# Patient Record
Sex: Female | Born: 1985 | Race: White | Hispanic: No | Marital: Single | Smoking: Current every day smoker
Health system: Southern US, Community
[De-identification: ages and names within clinical notes are randomized; demographics above are authoritative.]

## PROBLEM LIST (undated history)

## (undated) DIAGNOSIS — G43909 Migraine, unspecified, not intractable, without status migrainosus: Secondary | ICD-10-CM

## (undated) HISTORY — PX: WRIST SURGERY: SHX841

---
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
## 2007-08-22 ENCOUNTER — Emergency Department (HOSPITAL_COMMUNITY): Admission: EM | Admit: 2007-08-22 | Discharge: 2007-08-22 | Payer: Self-pay | Admitting: Emergency Medicine

## 2008-04-02 ENCOUNTER — Encounter: Admission: RE | Admit: 2008-04-02 | Discharge: 2008-04-02 | Payer: Self-pay | Admitting: Obstetrics & Gynecology

## 2008-04-12 ENCOUNTER — Emergency Department (HOSPITAL_COMMUNITY): Admission: EM | Admit: 2008-04-12 | Discharge: 2008-04-12 | Payer: Self-pay | Admitting: Emergency Medicine

## 2009-04-09 IMAGING — CR DG CHEST 2V
2 series · 2 of 2 positions shown · non-contrast
Comparison: None

CLINICAL DATA: Chest pain

CHEST - 2 VIEW

[view not recorded (1 of 2)]
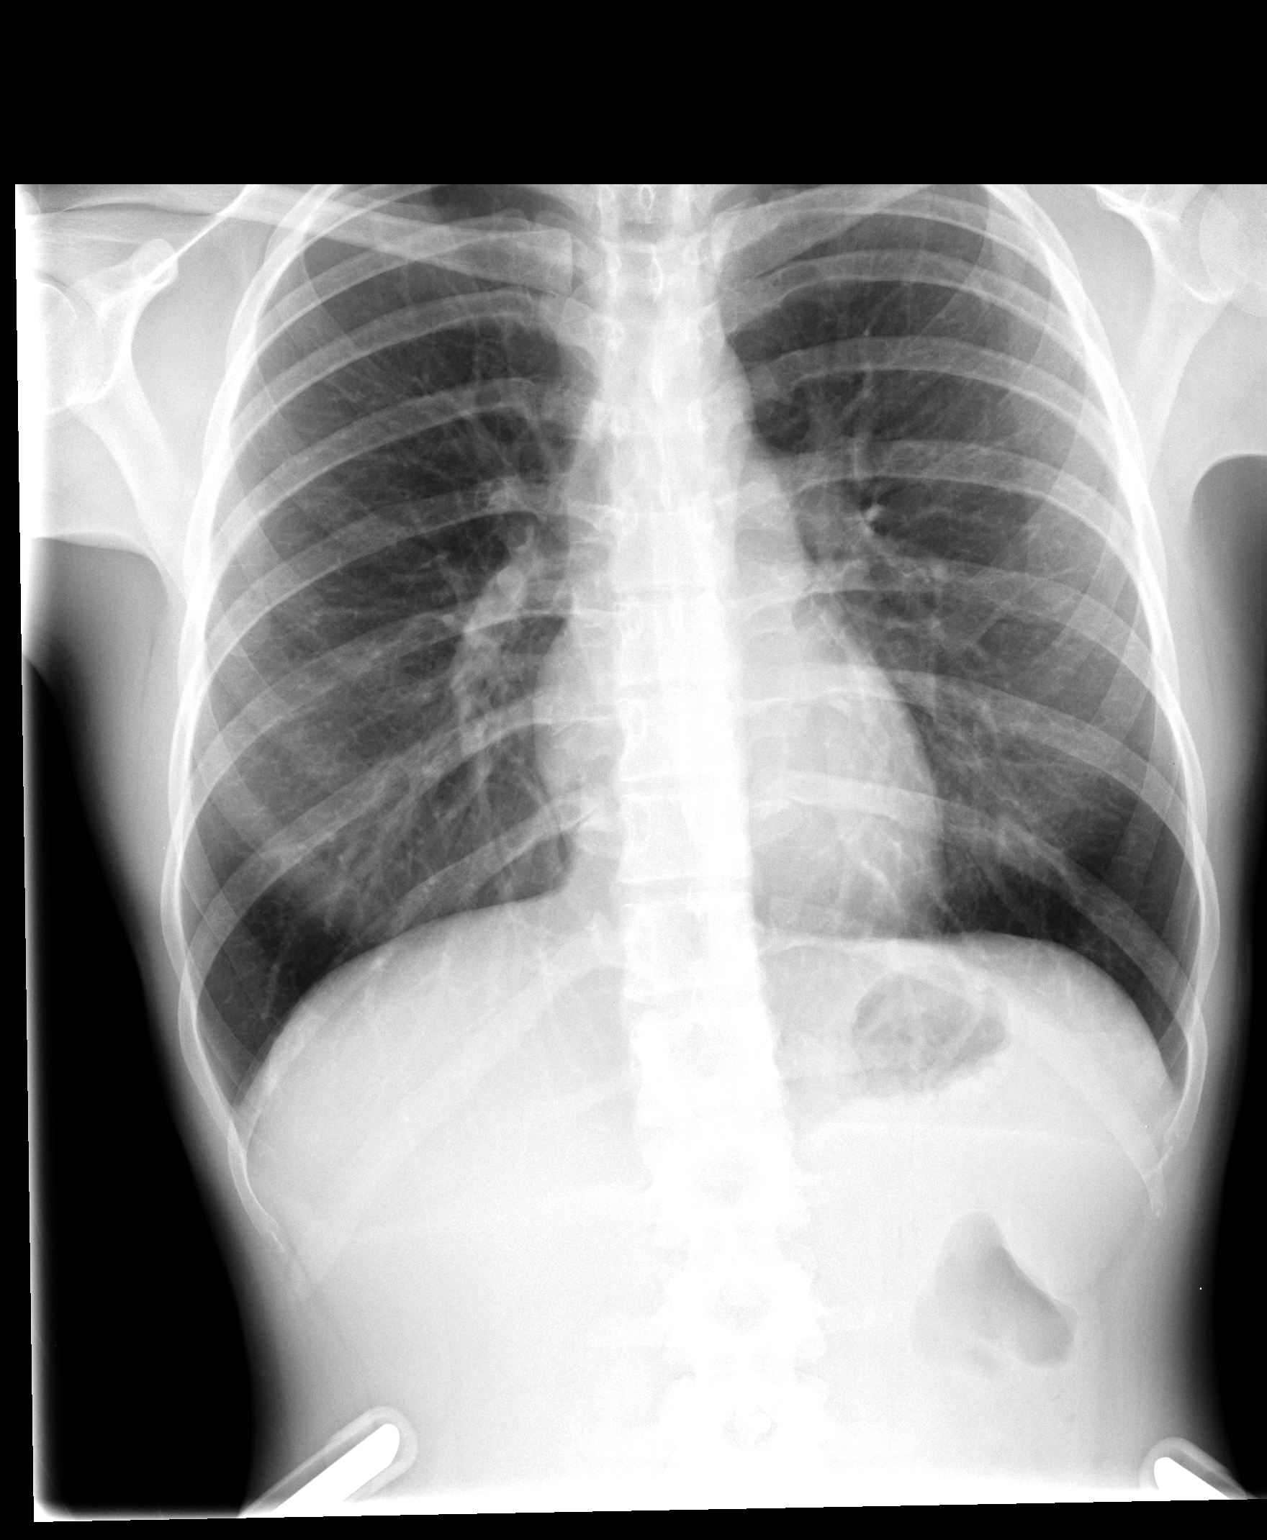

[view not recorded (2 of 2)]
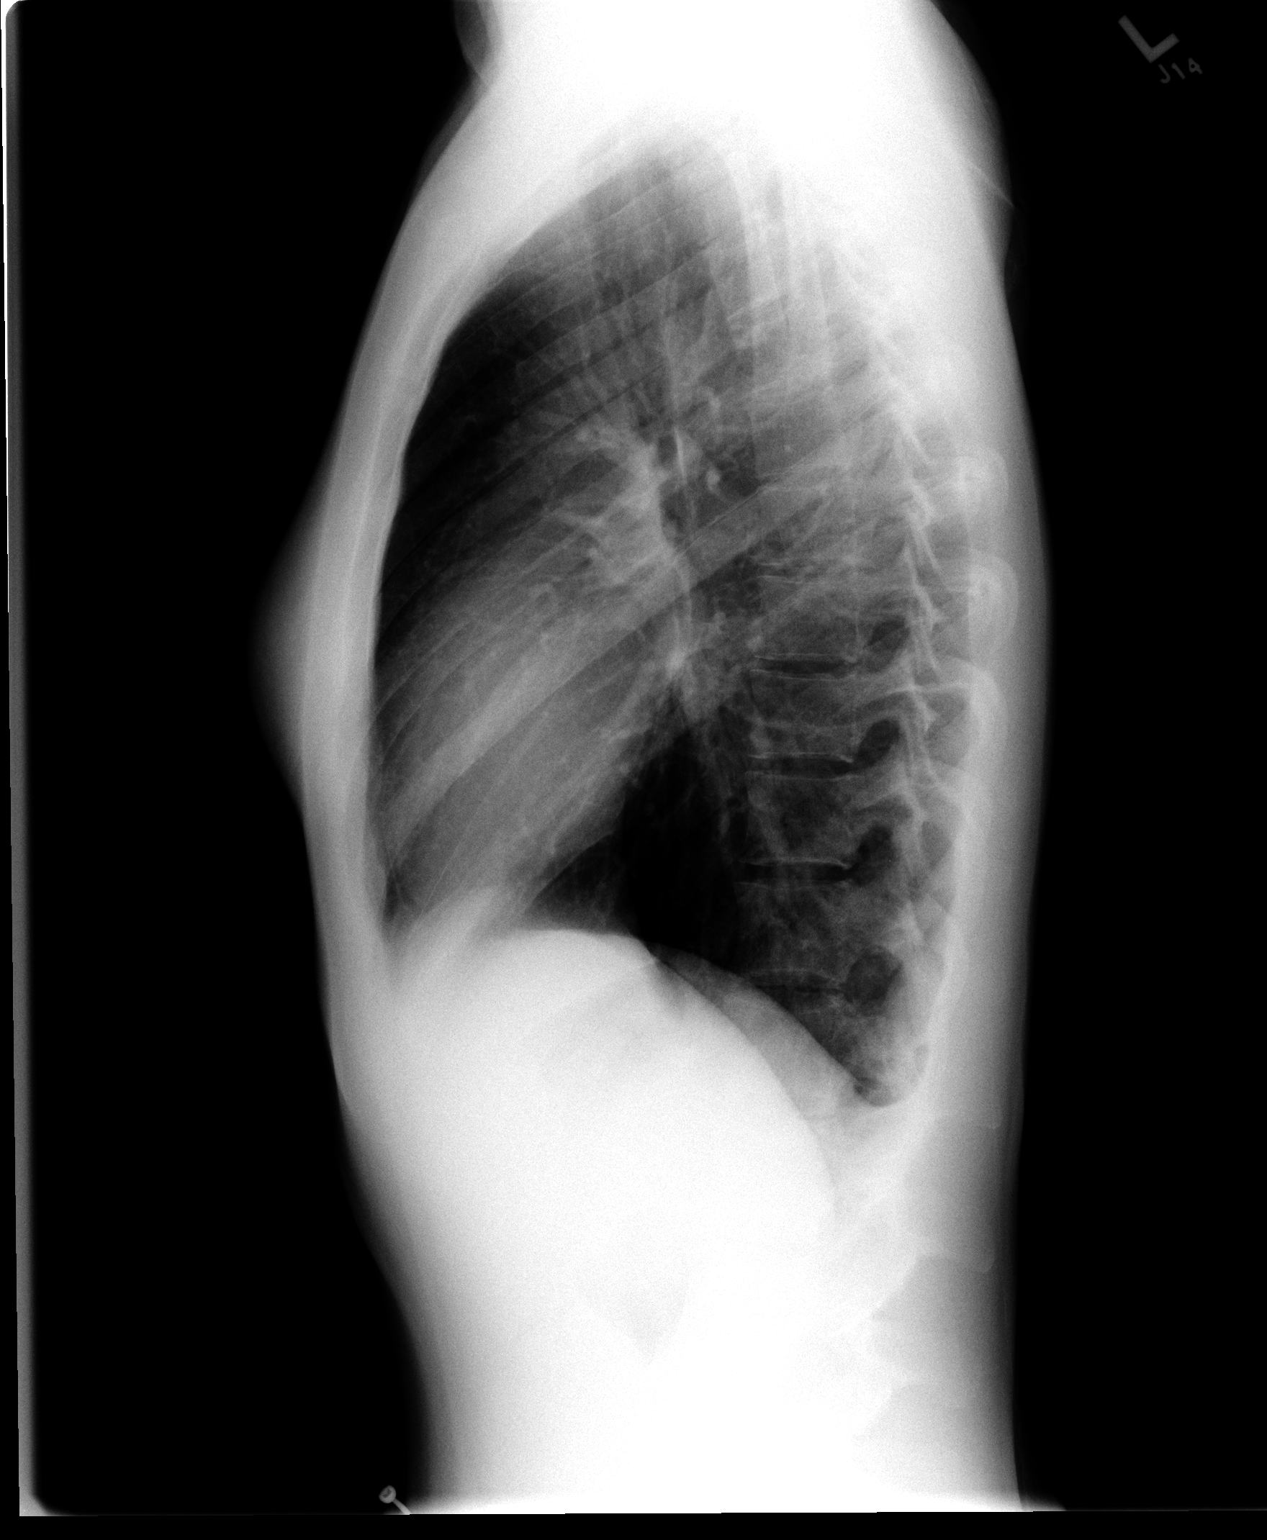

[2 of 2 positions shown; findings below may reference images not displayed]

FINDINGS: The cardiomediastinal silhouette is unremarkable.  There
is no acute infiltrate or pleural effusion.  Bony structures are
unremarkable.
IMPRESSION: No active disease.

## 2011-02-25 LAB — URINALYSIS, ROUTINE W REFLEX MICROSCOPIC
Bilirubin Urine: NEGATIVE
Glucose, UA: NEGATIVE
Ketones, ur: NEGATIVE
Nitrite: NEGATIVE
Protein, ur: NEGATIVE
Specific Gravity, Urine: 1.008
Urobilinogen, UA: 0.2
pH: 7

## 2011-02-25 LAB — URINE CULTURE

## 2011-02-25 LAB — URINE MICROSCOPIC-ADD ON

## 2011-02-25 LAB — PREGNANCY, URINE: Preg Test, Ur: NEGATIVE

## 2014-11-01 ENCOUNTER — Emergency Department (HOSPITAL_COMMUNITY)
Admission: EM | Admit: 2014-11-01 | Discharge: 2014-11-01 | Disposition: A | Discharge status: Home / Self Care | Payer: Self-pay | Attending: Emergency Medicine | Admitting: Emergency Medicine

## 2014-11-01 ENCOUNTER — Encounter (HOSPITAL_COMMUNITY): Payer: Self-pay | Admitting: Emergency Medicine

## 2014-11-01 DIAGNOSIS — Z3202 Encounter for pregnancy test, result negative: Secondary | ICD-10-CM | POA: Insufficient documentation

## 2014-11-01 DIAGNOSIS — Z720 Tobacco use: Secondary | ICD-10-CM | POA: Insufficient documentation

## 2014-11-01 DIAGNOSIS — G43809 Other migraine, not intractable, without status migrainosus: Secondary | ICD-10-CM | POA: Insufficient documentation

## 2014-11-01 DIAGNOSIS — Z79899 Other long term (current) drug therapy: Secondary | ICD-10-CM | POA: Insufficient documentation

## 2014-11-01 HISTORY — DX: Migraine, unspecified, not intractable, without status migrainosus: G43.909

## 2014-11-01 LAB — I-STAT BETA HCG BLOOD, ED (MC, WL, AP ONLY)

## 2014-11-01 MED ORDER — MAGNESIUM SULFATE 2 GM/50ML IV SOLN
2.0000 g | Freq: Once | INTRAVENOUS | Status: AC
  Administered 2014-11-01: 2 g via INTRAVENOUS
  Filled 2014-11-01: qty 50

## 2014-11-01 MED ORDER — KETOROLAC TROMETHAMINE 30 MG/ML IJ SOLN
30.0000 mg | Freq: Once | INTRAMUSCULAR | Status: AC
  Administered 2014-11-01: 30 mg via INTRAVENOUS
  Filled 2014-11-01: qty 1

## 2014-11-01 MED ORDER — HYDROMORPHONE HCL 1 MG/ML IJ SOLN
1.0000 mg | Freq: Once | INTRAMUSCULAR | Status: AC
  Administered 2014-11-01: 1 mg via INTRAVENOUS
  Filled 2014-11-01: qty 1

## 2014-11-01 MED ORDER — DIPHENHYDRAMINE HCL 50 MG/ML IJ SOLN
50.0000 mg | Freq: Once | INTRAMUSCULAR | Status: AC
  Administered 2014-11-01: 50 mg via INTRAVENOUS
  Filled 2014-11-01: qty 1

## 2014-11-01 MED ORDER — METOCLOPRAMIDE HCL 5 MG/ML IJ SOLN
10.0000 mg | Freq: Once | INTRAMUSCULAR | Status: AC
  Administered 2014-11-01: 10 mg via INTRAVENOUS
  Filled 2014-11-01: qty 2

## 2014-11-01 NOTE — ED Provider Notes (Signed)
CSN: [id number]     Arrival date & time 11/01/14  1148 History   First MD Initiated Contact with Patient 11/01/14 1158     Chief Complaint  Patient presents with  . Migraine     (Consider location/radiation/quality/duration/timing/severity/associated sxs/prior Treatment) HPI Jodi Carlson is a 29 y.o. female with history of migraines who comes in for evaluation of acute migraine. Patient states she has had a migraine off and on since Sunday. She reports some relief with taking her friend's Demerol, Phenergan and amitriptyline. She reports her headache now is similar to migraine pain that she has had in the past, localized to right hemisphere and behind right eye. Rates discomfort as severe. Onset was gradual. She reports associated nausea and vomiting, photophobia and phonophobia. Denies any numbness or weakness, fevers or chills, sick contacts, recent travel, pregnancy--states "I'm gay".  Past Medical History  Diagnosis Date  . Migraine    Past Surgical History  Procedure Laterality Date  . Wrist surgery Right    No family history on file. History  Substance Use Topics  . Smoking status: Current Every Day Smoker  . Smokeless tobacco: Not on file  . Alcohol Use: Not on file   OB History    No data available     Review of Systems A 10 point review of systems was completed and was negative except for pertinent positives and negatives as mentioned in the history of present illness     Allergies  Review of patient's allergies indicates no known allergies.  Home Medications   Prior to Admission medications   Medication Sig Start Date End Date Taking? Authorizing Provider  aspirin-acetaminophen-caffeine (EXCEDRIN MIGRAINE) [phone] MG per tablet Take 2 tablets by mouth every 6 (six) hours as needed for headache.   Yes Historical Provider, MD  cetirizine (ZYRTEC) 10 MG tablet Take 10 mg by mouth daily as needed for allergies.   Yes Historical Provider, MD  diphenhydrAMINE  (SOMINEX) 25 MG tablet Take 25 mg by mouth daily as needed for sleep.   Yes Historical Provider, MD  Multiple Vitamin (MULTIVITAMIN WITH MINERALS) TABS tablet Take 1 tablet by mouth daily.   Yes Historical Provider, MD   BP 106/65 mmHg  Pulse 78  Temp(Src) 97.8 F (36.6 C) (Oral)  Resp 18  SpO2 100%  LMP 10/24/2014 (Exact Date) Physical Exam  Constitutional: She is oriented to person, place, and time. She appears well-developed and well-nourished.  HENT:  Head: Normocephalic and atraumatic.  Mouth/Throat: Oropharynx is clear and moist.  Eyes: Conjunctivae are normal. Pupils are equal, round, and reactive to light. Right eye exhibits no discharge. Left eye exhibits no discharge. No scleral icterus.  Neck: Neck supple.  Cardiovascular: Normal rate, regular rhythm and normal heart sounds.   Pulmonary/Chest: Effort normal and breath sounds normal. No respiratory distress. She has no wheezes. She has no rales.  Abdominal: Soft. There is no tenderness.  Musculoskeletal: She exhibits no tenderness.  Neurological: She is alert and oriented to person, place, and time.  Cranial Nerves II-XII grossly intact. Motor and sensation 5/5 in all 4 extremities. Finger to nose coordination movements completed without difficulty. Gait baseline  Skin: Skin is warm and dry. No rash noted.  Psychiatric: She has a normal mood and affect.  Nursing note and vitals reviewed.   ED Course  Procedures (including critical care time) Labs Review Labs Reviewed  I-STAT BETA HCG BLOOD, ED ([facility], [facility], [facility] ONLY)    Imaging Review No results found.   EKG  Interpretation None     Meds given in ED:  Medications  ketorolac (TORADOL) 30 MG/ML injection 30 mg (30 mg Intravenous Given 11/01/14 1227)  metoCLOPramide (REGLAN) injection 10 mg (10 mg Intravenous Given 11/01/14 1227)  diphenhydrAMINE (BENADRYL) injection 50 mg (50 mg Intravenous Given 11/01/14 1227)  magnesium sulfate IVPB 2 g 50 mL (0 g Intravenous Stopped  11/01/14 1421)  HYDROmorphone (DILAUDID) injection 1 mg (1 mg Intravenous Given 11/01/14 1420)    Discharge Medication List as of 11/01/2014  3:00 PM     Filed Vitals:   11/01/14 1155 11/01/14 1235 11/01/14 1519  BP: 113/70 106/67 106/65  Pulse: 93 70 78  Temp: 97.8 F (36.6 C)    TempSrc: Oral    Resp: 18 18 18   SpO2: 97% 100% 100%    MDM  Vitals stable, afebrile Feels better after analgesia in ED. Reports improved symptoms after administration of magnesium. Normal neurological exam. No dizziness. Gait baseline. HA gradual in onset, progressively worsening. Similar to previous HA No hx of traumatic injury No hx of clotting disorder, peripartum or postpartum state, Immunocompromise Doubt Meningitis, CVA/SAH/ICH, Dural Venous Thrombosis, Eclampsia, Seizure, Central Lesion or Tumor, Giant Cell Arteritis, Cervical/Carotid or other vascular dissection. I have personally reviewed all labs, imaging, nursing/prvious notes during the patient's evaluation in the ED today. No evidence of other acute or emergent pathology that requires immediate intervention at this time. Pt stable, in good condition and is appropriate for discharge. DC with instructions to follow up with PCP within 48 hrs for further evaluation and management of symptoms. Referral to the [place] and wellness given.  Final diagnoses:  Other migraine without status migrainosus, not intractable       Joycie Peek, PA-C 11/01/14 1555  Tilden Fossa, MD 11/02/14 1151

## 2014-11-01 NOTE — ED Notes (Signed)
Pt c/o migraine off and on for the past week along with n/v, last vomit episode was this morning.

## 2014-11-01 NOTE — ED Notes (Signed)
Questions r/t dc denied. Pt ambulatory but will use wheelchair out. Pt &ox4

## 2014-11-01 NOTE — Discharge Instructions (Signed)
Headaches, Frequently Asked Questions °MIGRAINE HEADACHES °Q: What is migraine? What causes it? How can I treat it? °A: Generally, migraine headaches begin as a dull ache. Then they develop into a constant, throbbing, and pulsating pain. You may experience pain at the temples. You may experience pain at the front or back of one or both sides of the head. The pain is usually accompanied by a combination of: °· Nausea. °· Vomiting. °· Sensitivity to light and noise. °Some people (about 15%) experience an aura (see below) before an attack. The cause of migraine is believed to be chemical reactions in the brain. Treatment for migraine may include over-the-counter or prescription medications. It may also include self-help techniques. These include relaxation training and biofeedback.  °Q: What is an aura? °A: About 15% of people with migraine get an "aura". This is a sign of neurological symptoms that occur before a migraine headache. You may see wavy or jagged lines, dots, or flashing lights. You might experience tunnel vision or blind spots in one or both eyes. The aura can include visual or auditory hallucinations (something imagined). It may include disruptions in smell (such as strange odors), taste or touch. Other symptoms include: °· Numbness. °· A "pins and needles" sensation. °· Difficulty in recalling or speaking the correct word. °These neurological events may last as long as 60 minutes. These symptoms will fade as the headache begins. °Q: What is a trigger? °A: Certain physical or environmental factors can lead to or "trigger" a migraine. These include: °· Foods. °· Hormonal changes. °· Weather. °· Stress. °It is important to remember that triggers are different for everyone. To help prevent migraine attacks, you need to figure out which triggers affect you. Keep a headache diary. This is a good way to track triggers. The diary will help you talk to your healthcare professional about your condition. °Q: Does  weather affect migraines? °A: Bright sunshine, hot, humid conditions, and drastic changes in barometric pressure may lead to, or "trigger," a migraine attack in some people. But studies have shown that weather does not act as a trigger for everyone with migraines. °Q: What is the link between migraine and hormones? °A: Hormones start and regulate many of your body's functions. Hormones keep your body in balance within a constantly changing environment. The levels of hormones in your body are unbalanced at times. Examples are during menstruation, pregnancy, or menopause. That can lead to a migraine attack. In fact, about three quarters of all women with migraine report that their attacks are related to the menstrual cycle.  °Q: Is there an increased risk of stroke for migraine sufferers? °A: The likelihood of a migraine attack causing a stroke is very remote. That is not to say that migraine sufferers cannot have a stroke associated with their migraines. In persons under age 40, the most common associated factor for stroke is migraine headache. But over the course of a person's normal life span, the occurrence of migraine headache may actually be associated with a reduced risk of dying from cerebrovascular disease due to stroke.  °Q: What are acute medications for migraine? °A: Acute medications are used to treat the pain of the headache after it has started. Examples over-the-counter medications, NSAIDs, ergots, and triptans.  °Q: What are the triptans? °A: Triptans are the newest class of abortive medications. They are specifically targeted to treat migraine. Triptans are vasoconstrictors. They moderate some chemical reactions in the brain. The triptans work on receptors in your brain. Triptans help   to restore the balance of a neurotransmitter called serotonin. Fluctuations in levels of serotonin are thought to be a main cause of migraine.  °Q: Are over-the-counter medications for migraine effective? °A:  Over-the-counter, or "OTC," medications may be effective in relieving mild to moderate pain and associated symptoms of migraine. But you should see your caregiver before beginning any treatment regimen for migraine.  °Q: What are preventive medications for migraine? °A: Preventive medications for migraine are sometimes referred to as "prophylactic" treatments. They are used to reduce the frequency, severity, and length of migraine attacks. Examples of preventive medications include antiepileptic medications, antidepressants, beta-blockers, calcium channel blockers, and NSAIDs (nonsteroidal anti-inflammatory drugs). °Q: Why are anticonvulsants used to treat migraine? °A: During the past few years, there has been an increased interest in antiepileptic drugs for the prevention of migraine. They are sometimes referred to as "anticonvulsants". Both epilepsy and migraine may be caused by similar reactions in the brain.  °Q: Why are antidepressants used to treat migraine? °A: Antidepressants are typically used to treat people with depression. They may reduce migraine frequency by regulating chemical levels, such as serotonin, in the brain.  °Q: What alternative therapies are used to treat migraine? °A: The term "alternative therapies" is often used to describe treatments considered outside the scope of conventional Western medicine. Examples of alternative therapy include acupuncture, acupressure, and yoga. Another common alternative treatment is herbal therapy. Some herbs are believed to relieve headache pain. Always discuss alternative therapies with your caregiver before proceeding. Some herbal products contain arsenic and other toxins. °TENSION HEADACHES °Q: What is a tension-type headache? What causes it? How can I treat it? °A: Tension-type headaches occur randomly. They are often the result of temporary stress, anxiety, fatigue, or anger. Symptoms include soreness in your temples, a tightening band-like sensation  around your head (a "vice-like" ache). Symptoms can also include a pulling feeling, pressure sensations, and contracting head and neck muscles. The headache begins in your forehead, temples, or the back of your head and neck. Treatment for tension-type headache may include over-the-counter or prescription medications. Treatment may also include self-help techniques such as relaxation training and biofeedback. °CLUSTER HEADACHES °Q: What is a cluster headache? What causes it? How can I treat it? °A: Cluster headache gets its name because the attacks come in groups. The pain arrives with little, if any, warning. It is usually on one side of the head. A tearing or bloodshot eye and a runny nose on the same side of the headache may also accompany the pain. Cluster headaches are believed to be caused by chemical reactions in the brain. They have been described as the most severe and intense of any headache type. Treatment for cluster headache includes prescription medication and oxygen. °SINUS HEADACHES °Q: What is a sinus headache? What causes it? How can I treat it? °A: When a cavity in the bones of the face and skull (a sinus) becomes inflamed, the inflammation will cause localized pain. This condition is usually the result of an allergic reaction, a tumor, or an infection. If your headache is caused by a sinus blockage, such as an infection, you will probably have a fever. An x-ray will confirm a sinus blockage. Your caregiver's treatment might include antibiotics for the infection, as well as antihistamines or decongestants.  °REBOUND HEADACHES °Q: What is a rebound headache? What causes it? How can I treat it? °A: A pattern of taking acute headache medications too often can lead to a condition known as "rebound headache."   A pattern of taking too much headache medication includes taking it more than 2 days per week or in excessive amounts. That means more than the label or a caregiver advises. With rebound  headaches, your medications not only stop relieving pain, they actually begin to cause headaches. Doctors treat rebound headache by tapering the medication that is being overused. Sometimes your caregiver will gradually substitute a different type of treatment or medication. Stopping may be a challenge. Regularly overusing a medication increases the potential for serious side effects. Consult a caregiver if you regularly use headache medications more than 2 days per week or more than the label advises. °ADDITIONAL QUESTIONS AND ANSWERS °Q: What is biofeedback? °A: Biofeedback is a self-help treatment. Biofeedback uses special equipment to monitor your body's involuntary physical responses. Biofeedback monitors: °· Breathing. °· Pulse. °· Heart rate. °· Temperature. °· Muscle tension. °· Brain activity. °Biofeedback helps you refine and perfect your relaxation exercises. You learn to control the physical responses that are related to stress. Once the technique has been mastered, you do not need the equipment any more. °Q: Are headaches hereditary? °A: Four out of five (80%) of people that suffer report a family history of migraine. Scientists are not sure if this is genetic or a family predisposition. Despite the uncertainty, a child has a 50% chance of having migraine if one parent suffers. The child has a 75% chance if both parents suffer.  °Q: Can children get headaches? °A: By the time they reach high school, most young people have experienced some type of headache. Many safe and effective approaches or medications can prevent a headache from occurring or stop it after it has begun.  °Q: What type of doctor should I see to diagnose and treat my headache? °A: Start with your primary caregiver. Discuss his or her experience and approach to headaches. Discuss methods of classification, diagnosis, and treatment. Your caregiver may decide to recommend you to a headache specialist, depending upon your symptoms or other  physical conditions. Having diabetes, allergies, etc., may require a more comprehensive and inclusive approach to your headache. The [employer] will provide, upon request, a list of NHF physician members in your state. °Document Released: 08/01/2003 Document Revised: 08/03/2011 Document Reviewed: 01/09/2008 °Exit[employer]® Patient Information ©2015 [employer]. This information is not intended to replace advice given to you by your health care provider. Make sure you discuss any questions you have with your health care provider. ° °Migraine Headache °A migraine headache is an intense, throbbing pain on one or both sides of your head. A migraine can last for 30 minutes to several hours. °CAUSES  °The exact cause of a migraine headache is not always known. However, a migraine may be caused when nerves in the brain become irritated and release chemicals that cause inflammation. This causes pain. °Certain things may also trigger migraines, such as: °· Alcohol. °· Smoking. °· Stress. °· Menstruation. °· Aged cheeses. °· Foods or drinks that contain nitrates, glutamate, aspartame, or tyramine. °· Lack of sleep. °· Chocolate. °· Caffeine. °· Hunger. °· Physical exertion. °· Fatigue. °· Medicines used to treat chest pain (nitroglycerine), birth control pills, estrogen, and some blood pressure medicines. °SIGNS AND SYMPTOMS °· Pain on one or both sides of your head. °· Pulsating or throbbing pain. °· Severe pain that prevents daily activities. °· Pain that is aggravated by any physical activity. °· Nausea, vomiting, or both. °· Dizziness. °· Pain with exposure to bright lights, loud noises, or activity. °·   General sensitivity to bright lights, loud noises, or smells. °Before you get a migraine, you may get warning signs that a migraine is coming (aura). An aura may include: °· Seeing flashing lights. °· Seeing bright spots, halos, or zigzag lines. °· Having tunnel vision or blurred vision. °· Having feelings  of numbness or tingling. °· Having trouble talking. °· Having muscle weakness. °DIAGNOSIS  °A migraine headache is often diagnosed based on: °· Symptoms. °· Physical exam. °· A CT scan or MRI of your head. These imaging tests cannot diagnose migraines, but they can help rule out other causes of headaches. °TREATMENT °Medicines may be given for pain and nausea. Medicines can also be given to help prevent recurrent migraines.  °HOME CARE INSTRUCTIONS °· Only take over-the-counter or prescription medicines for pain or discomfort as directed by your health care provider. The use of long-term narcotics is not recommended. °· Lie down in a dark, quiet room when you have a migraine. °· Keep a journal to find out what may trigger your migraine headaches. For example, write down: °¨ What you eat and drink. °¨ How much sleep you get. °¨ Any change to your diet or medicines. °· Limit alcohol consumption. °· Quit smoking if you smoke. °· Get 7-9 hours of sleep, or as recommended by your health care provider. °· Limit stress. °· Keep lights dim if bright lights bother you and make your migraines worse. °SEEK IMMEDIATE MEDICAL CARE IF:  °· Your migraine becomes severe. °· You have a fever. °· You have a stiff neck. °· You have vision loss. °· You have muscular weakness or loss of muscle control. °· You start losing your balance or have trouble walking. °· You feel faint or pass out. °· You have severe symptoms that are different from your first symptoms. °MAKE SURE YOU:  °· Understand these instructions. °· Will watch your condition. °· Will get help right away if you are not doing well or get worse. °Document Released: 05/11/2005 Document Revised: 09/25/2013 Document Reviewed: 01/16/2013 °ExitCare® Patient Information ©2015 [employer]. This information is not intended to replace advice given to you by your health care provider. Make sure you discuss any questions you have with your health care provider. ° °
